# Patient Record
Sex: Female | Born: 1990 | Race: White | Hispanic: No | Marital: Single | Smoking: Current every day smoker
Health system: Southern US, Community
[De-identification: ages and names within clinical notes are randomized; demographics above are authoritative.]

---
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
## 2013-02-03 ENCOUNTER — Encounter (HOSPITAL_COMMUNITY): Payer: Self-pay | Admitting: Emergency Medicine

## 2013-02-03 DIAGNOSIS — R609 Edema, unspecified: Secondary | ICD-10-CM | POA: Insufficient documentation

## 2013-02-03 DIAGNOSIS — K047 Periapical abscess without sinus: Secondary | ICD-10-CM | POA: Insufficient documentation

## 2013-02-03 DIAGNOSIS — F17200 Nicotine dependence, unspecified, uncomplicated: Secondary | ICD-10-CM | POA: Insufficient documentation

## 2013-02-03 DIAGNOSIS — R509 Fever, unspecified: Secondary | ICD-10-CM | POA: Insufficient documentation

## 2013-02-03 NOTE — ED Notes (Signed)
Pt c/o right lower tooth pain x's 3 days.  St's woke up this am with swelling to right side of face.

## 2013-02-04 ENCOUNTER — Emergency Department (HOSPITAL_COMMUNITY)
Admission: EM | Admit: 2013-02-04 | Discharge: 2013-02-04 | Disposition: A | Discharge status: Home / Self Care | Payer: Self-pay | Attending: Emergency Medicine | Admitting: Emergency Medicine

## 2013-02-04 ENCOUNTER — Emergency Department (HOSPITAL_COMMUNITY): Payer: Self-pay

## 2013-02-04 DIAGNOSIS — K047 Periapical abscess without sinus: Secondary | ICD-10-CM

## 2013-02-04 MED ORDER — OXYCODONE-ACETAMINOPHEN 5-325 MG PO TABS: 1.0000 | ORAL_TABLET | ORAL | Status: AC | PRN

## 2013-02-04 MED ORDER — HYDROMORPHONE HCL PF 1 MG/ML IJ SOLN
0.5000 mg | Freq: Once | INTRAMUSCULAR | Status: AC
  Administered 2013-02-04: 0.5 mg via INTRAVENOUS
  Filled 2013-02-04: qty 1

## 2013-02-04 MED ORDER — IOHEXOL 300 MG/ML  SOLN
80.0000 mL | Freq: Once | INTRAMUSCULAR | Status: AC | PRN
  Administered 2013-02-04: 80 mL via INTRAVENOUS

## 2013-02-04 MED ORDER — DIPHENHYDRAMINE HCL 12.5 MG/5ML PO ELIX: 12.5000 mg | ORAL_SOLUTION | Freq: Once | ORAL | Status: DC

## 2013-02-04 MED ORDER — ACETAMINOPHEN 325 MG PO TABS
650.0000 mg | ORAL_TABLET | Freq: Once | ORAL | Status: AC
  Administered 2013-02-04: 650 mg via ORAL
  Filled 2013-02-04: qty 2

## 2013-02-04 MED ORDER — MORPHINE SULFATE 4 MG/ML IJ SOLN
4.0000 mg | Freq: Once | INTRAMUSCULAR | Status: AC
  Administered 2013-02-04: 4 mg via INTRAVENOUS
  Filled 2013-02-04: qty 1

## 2013-02-04 MED ORDER — OXYCODONE-ACETAMINOPHEN 5-325 MG PO TABS
1.0000 | ORAL_TABLET | Freq: Once | ORAL | Status: DC
  Filled 2013-02-04: qty 1

## 2013-02-04 MED ORDER — DIPHENHYDRAMINE HCL 12.5 MG/5ML PO ELIX
12.5000 mg | ORAL_SOLUTION | Freq: Once | ORAL | Status: AC
  Administered 2013-02-04: 12.5 mg via ORAL
  Filled 2013-02-04: qty 10

## 2013-02-04 MED ORDER — OXYCODONE-ACETAMINOPHEN 5-325 MG PO TABS
1.0000 | ORAL_TABLET | Freq: Once | ORAL | Status: AC
  Administered 2013-02-04: 1 via ORAL
  Filled 2013-02-04: qty 1

## 2013-02-04 MED ORDER — CLINDAMYCIN HCL 150 MG PO CAPS: 300.0000 mg | ORAL_CAPSULE | Freq: Three times a day (TID) | ORAL | Status: AC

## 2013-02-04 MED ORDER — OXYCODONE-ACETAMINOPHEN 5-325 MG PO TABS: 2.0000 | ORAL_TABLET | Freq: Once | ORAL | Status: DC

## 2013-02-04 MED ORDER — CLINDAMYCIN PHOSPHATE 600 MG/50ML IV SOLN
600.0000 mg | Freq: Once | INTRAVENOUS | Status: AC
  Administered 2013-02-04: 600 mg via INTRAVENOUS
  Filled 2013-02-04: qty 50

## 2013-02-04 MED ORDER — MORPHINE SULFATE 4 MG/ML IJ SOLN
4.0000 mg | Freq: Once | INTRAMUSCULAR | Status: AC
Start: 2013-02-04 — End: 2013-02-04
  Administered 2013-02-04: 4 mg via INTRAVENOUS
  Filled 2013-02-04: qty 1

## 2013-02-04 NOTE — ED Notes (Signed)
Patient currently waiting med time before discharge.  

## 2013-02-04 NOTE — ED Provider Notes (Signed)
CSN: [id number]     Arrival date & time 02/03/13  2304 History   First MD Initiated Contact with Patient 02/04/13 0017     Chief Complaint  Patient presents with  . Dental Pain   (Consider location/radiation/quality/duration/timing/severity/associated sxs/prior Treatment) HPI History provided by pt.   Pt developed gradually worsening, currently severe, right lower dental pain 3 days ago.  Developed associated edema of right cheek last night.  Pain aggravated by opening mouth and eating.  Associated w/ elevated temperature, max 99.9.  Has been evaluated by an endodontist for infection of this tooth and told a root canal was unnecessary.  History reviewed. No pertinent past medical history. History reviewed. No pertinent past surgical history. No family history on file. History  Substance Use Topics  . Smoking status: Current Every Day Smoker  . Smokeless tobacco: Not on file  . Alcohol Use: Yes     Comment: occassional   OB History   Grav Para Term Preterm Abortions TAB SAB Ect Mult Living                 Review of Systems  All other systems reviewed and are negative.    Allergies  Hydrocodone; Iodine; Oxycodone; and Shellfish allergy  Home Medications  No current outpatient prescriptions on file. BP 137/83  Pulse 113  Temp(Src) 99.2 F (37.3 C) (Oral)  Resp 18  SpO2 100%  LMP 01/20/2013 Physical Exam  Nursing note and vitals reviewed. Constitutional: She is oriented to person, place, and time. She appears well-developed and well-nourished. No distress.  HENT:  Head: Normocephalic and atraumatic.  Significant edema/induration and tenderness w/ guarding of right buccal mucosa.  No fluctuance or defined abscess.  Posterior half of right lower 1st molar avulsed.  Severe tenderness of this tooth.    Eyes:  Normal appearance  Neck: Normal range of motion.  Pulmonary/Chest: Effort normal.  Musculoskeletal: Normal range of motion.  Lymphadenopathy:    She has no cervical  adenopathy.  Neurological: She is alert and oriented to person, place, and time.  Psychiatric: She has a normal mood and affect. Her behavior is normal.    ED Course  Procedures (including critical care time) Labs Review Labs Reviewed - No data to display Imaging Review Ct Maxillofacial W/cm  02/04/2013   CLINICAL DATA:  Right lower tooth pain for 3 days. Right facial swelling.  EXAM: CT MAXILLOFACIAL WITH CONTRAST  TECHNIQUE: Multidetector CT imaging of the maxillofacial structures was performed with intravenous contrast. Multiplanar CT image reconstructions were also generated. A small metallic BB was placed on the right temple in order to reliably differentiate right from left.  CONTRAST:  80mL OMNIPAQUE IOHEXOL 300 MG/ML  SOLN  COMPARISON:  CT HEAD W/O CM dated 09/16/2010; MR HEAD WO/W CM dated 05/29/2010  FINDINGS: Significant soft tissue swelling is noted along the right mandible, with a focal 1.9 x 0.6 cm collection of fluid within the soft tissues directly adjacent to the right angle of the mandible. This appears to reflect cortical breakthrough from a periapical abscess at the root of the right first mandibular molar. Soft tissue inflammation extends superiorly along the right masseter muscle.  There is no evidence of fracture or dislocation. The maxilla and mandible appear intact. The nasal bone is unremarkable in appearance. A large dental caries is noted at the right first mandibular molar, and additional left-sided mandibular dental caries are seen.  The orbits are intact bilaterally. There is mild partial opacification of the maxillary sinuses bilaterally. The  remaining visualized paranasal sinuses and mastoid air cells are well-aerated.  The parapharyngeal fat planes are preserved. The nasopharynx, oropharynx and hypopharynx are unremarkable in appearance. The visualized portions of the valleculae and piriform sinuses are grossly unremarkable.  The parotid and submandibular glands are within  normal limits. No cervical lymphadenopathy is seen. The visualized portions of the brain are unremarkable in appearance.  IMPRESSION: 1. Significant soft tissue swelling along the right mandible, with a focal 1.9 x 0.6 cm collection of fluid within the soft tissues directly adjacent to the right angle of the mandible. This appears to reflect cortical breakthrough from a periapical abscess at the root of the right first mandibular molar. Soft tissue inflammation extends superiorly along the right masseter muscle. 2. Large dental caries at the right first mandibular molar, and additional left-sided mandibular dental caries seen. 3. Mild partial opacification of the maxillary sinuses bilaterally.   Electronically Signed   By: Roanna Raider M.D.   On: 02/04/2013 04:13    EKG Interpretation   None       MDM   1. Dental abscess    23yo healthy F presents w/ dental pain and buccal mucosa edema.  Suspect periapical abscess.   She has trismus currently but I suspect that it is d/t degree of pain.  Pt to receive IV clinda and morphine.  Will reassess shortly.  1:18 AM  Pain improved and patient able to open mouth wider, but swelling has increased and pt now febrile.  CT maxillofacial ordered.  Pt received 0.5mg  IV dilaudid for pain and tylenol for fever.    CT shows fluid collect adjacent to R mandible that appears to be an extension of infection at 1st lower molar.  Results discussed w/ pt and her mother.  Dr. Jeanice Lim consulted by phone and will see patient in office this morning.  Pt prescribed clinda, her next dose to be taken this morning, as well as percocet. Fever resolved but VS otherwise stable at time of discharge.       Otilio Miu, PA-C 02/04/13 [phone]

## 2013-02-04 NOTE — ED Provider Notes (Signed)
Medical screening examination/treatment/procedure(s) were performed by non-physician practitioner and as supervising physician I was immediately available for consultation/collaboration.    Javion Holmer M Eliah Ozawa, MD 02/04/13 1700 

## 2013-02-04 NOTE — Discharge Instructions (Signed)
Take antibiotic as prescribed.  Take percocet as needed for severe pain.  Do not drive within four hours of taking this medication (may cause drowsiness or confusion).   You can take with benadryl if it is causing you to itch; discontinue immediately if you develop hives. Follow up with the oral surgeon this morning.   You may return to the ER if symptoms worsen or you have any other concerns.
